# Patient Record
Sex: Male | Born: 1965
Health system: Southern US, Community
[De-identification: ages and names within clinical notes are randomized; demographics above are authoritative.]

---

## 2013-03-05 ENCOUNTER — Other Ambulatory Visit: Payer: Self-pay | Admitting: Family Medicine

## 2013-03-05 DIAGNOSIS — N6320 Unspecified lump in the left breast, unspecified quadrant: Secondary | ICD-10-CM

## 2013-03-16 ENCOUNTER — Ambulatory Visit
Admission: RE | Admit: 2013-03-16 | Discharge: 2013-03-16 | Disposition: A | Discharge status: Home / Self Care | Payer: BC Managed Care – PPO | Source: Ambulatory Visit | Attending: Family Medicine | Admitting: Family Medicine

## 2013-03-16 DIAGNOSIS — N6320 Unspecified lump in the left breast, unspecified quadrant: Secondary | ICD-10-CM

## 2014-04-29 ENCOUNTER — Other Ambulatory Visit: Payer: Self-pay | Admitting: Family Medicine

## 2014-04-29 ENCOUNTER — Ambulatory Visit
Admission: RE | Admit: 2014-04-29 | Discharge: 2014-04-29 | Disposition: A | Discharge status: Home / Self Care | Payer: BLUE CROSS/BLUE SHIELD | Source: Ambulatory Visit | Attending: Family Medicine | Admitting: Family Medicine

## 2014-04-29 DIAGNOSIS — M5412 Radiculopathy, cervical region: Secondary | ICD-10-CM

## 2014-05-10 ENCOUNTER — Other Ambulatory Visit: Payer: Self-pay | Admitting: Family Medicine

## 2014-05-10 DIAGNOSIS — M542 Cervicalgia: Secondary | ICD-10-CM

## 2014-05-13 ENCOUNTER — Ambulatory Visit
Admission: RE | Admit: 2014-05-13 | Discharge: 2014-05-13 | Disposition: A | Discharge status: Home / Self Care | Payer: BLUE CROSS/BLUE SHIELD | Source: Ambulatory Visit | Attending: Family Medicine | Admitting: Family Medicine

## 2014-05-13 DIAGNOSIS — M542 Cervicalgia: Secondary | ICD-10-CM

## 2016-03-08 IMAGING — MR MR CERVICAL SPINE W/O CM
4 of 5 series · 25 of 48 positions shown · non-contrast
Comparison: None.

CLINICAL DATA: Right arm pain and numbness beginning after lifting
weights.

EXAM:
MRI CERVICAL SPINE WITHOUT CONTRAST
TECHNIQUE: Multiplanar, multisequence MR imaging of the cervical spine was
performed. No intravenous contrast was administered.

[Series 4: T1 · sagittal · 3.3mm · 0.43mm/px · 6 of 12 slices shown]
[im 1/12]
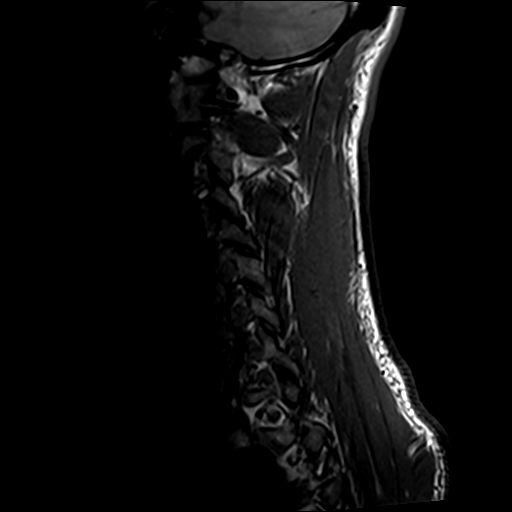
[im 3/12]
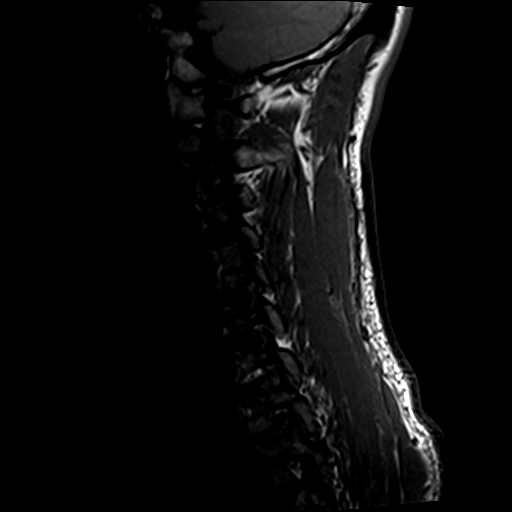
[im 5/12]
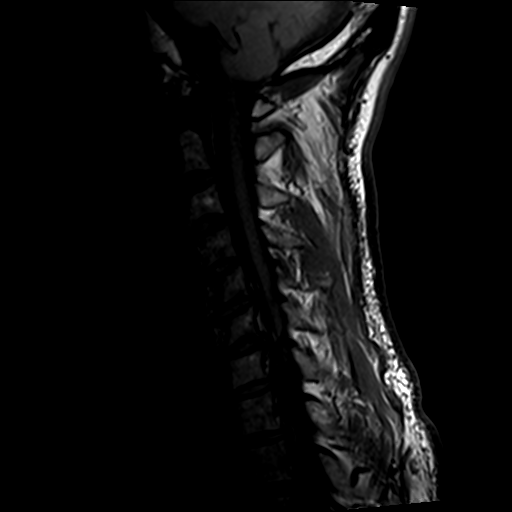
[im 7/12]
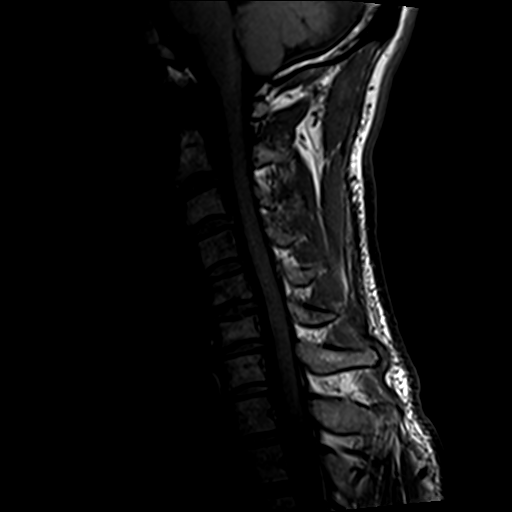
[im 9/12]
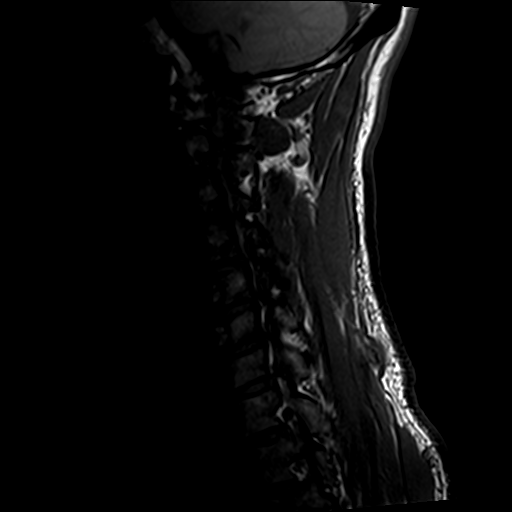
[im 12/12]
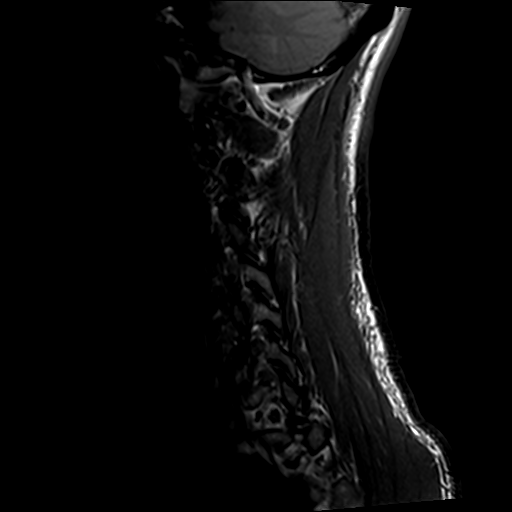

[Series 5: tir sag · sagittal · 3.3mm · 0.43mm/px · 4 of 12 slices shown]
[im 1/12]
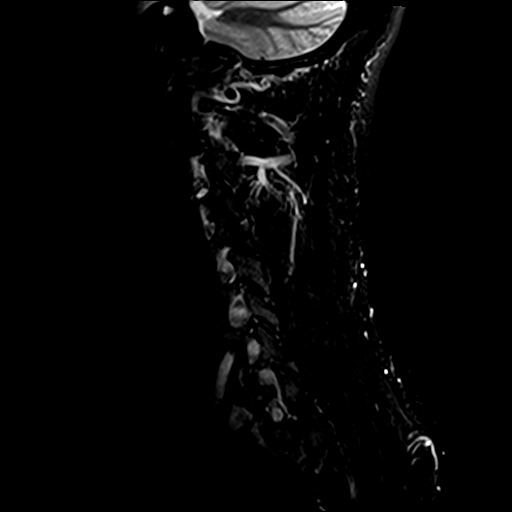
[im 2/12]
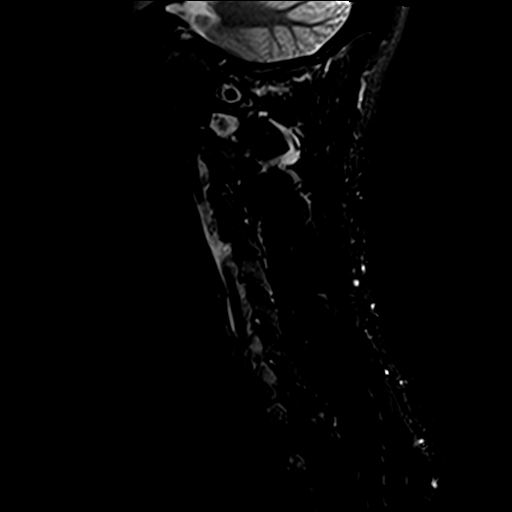
[im 6/12]
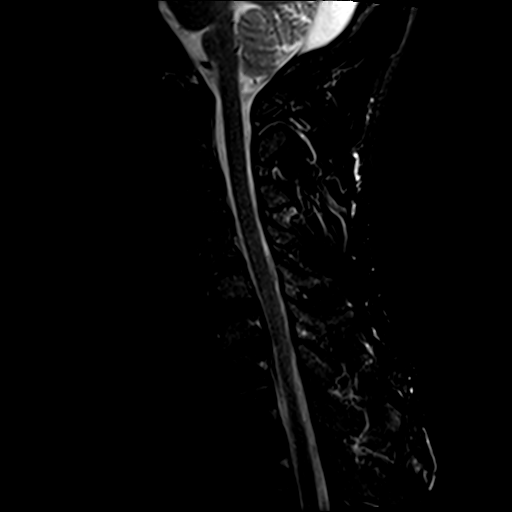
[im 10/12]
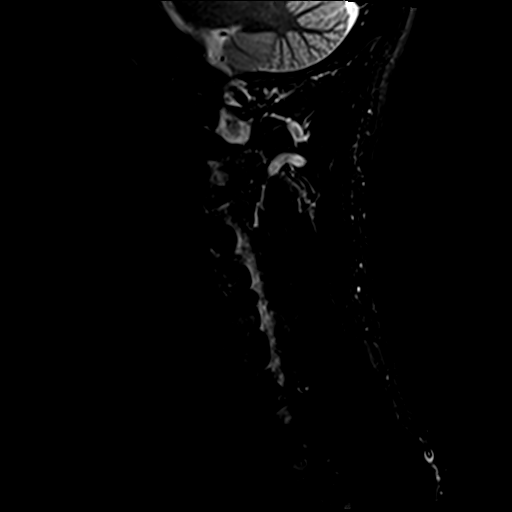

[Series 7: T2 · axial · 3.5mm · 0.70mm/px · z∈[-63,+34]mm · 8 of 24 slices shown (1 of 2)]
[im 1/24]
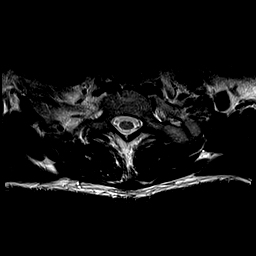
[im 4/24]
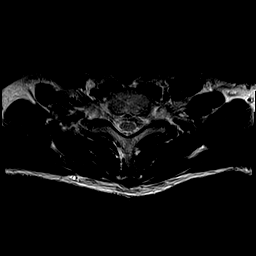
[im 8/24]
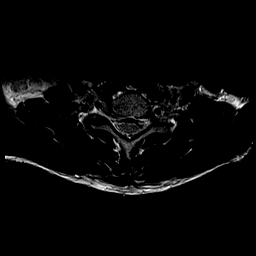
[im 11/24]
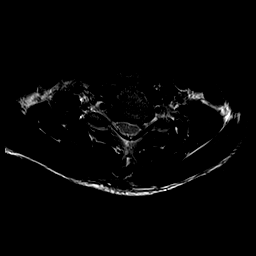
[im 13/24]
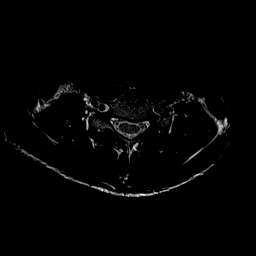
[im 16/24]
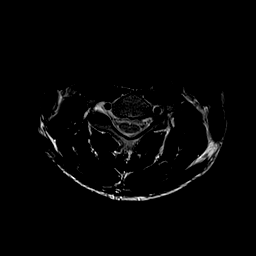
[im 20/24]
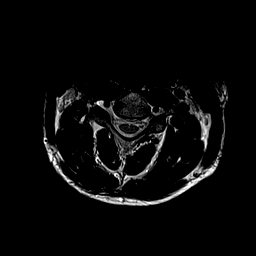
[im 24/24]
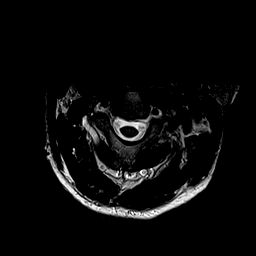

[Series 8: T2 · sagittal · 3.3mm · 0.43mm/px · 7 of 12 slices shown (2 of 2)]
[im 1/12]
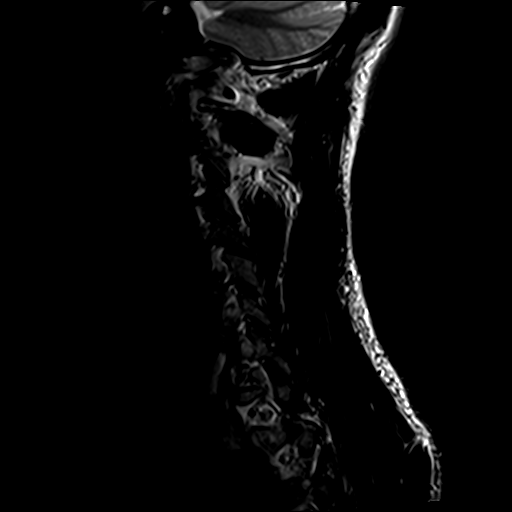
[im 2/12]
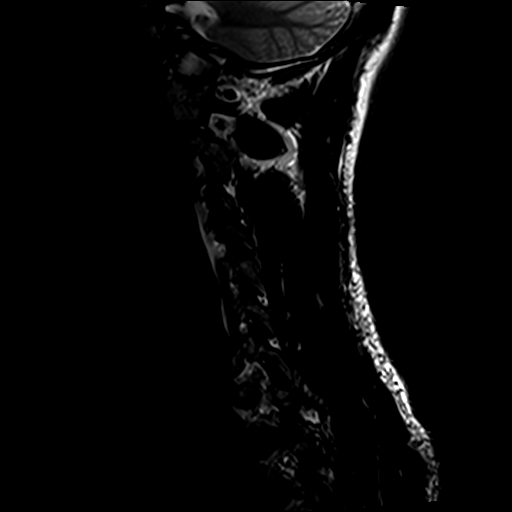
[im 4/12]
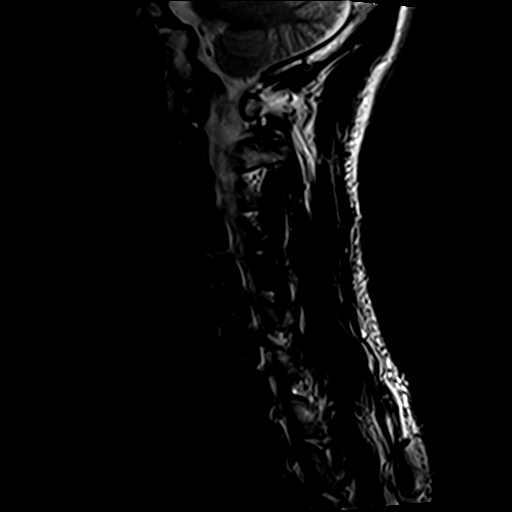
[im 6/12]
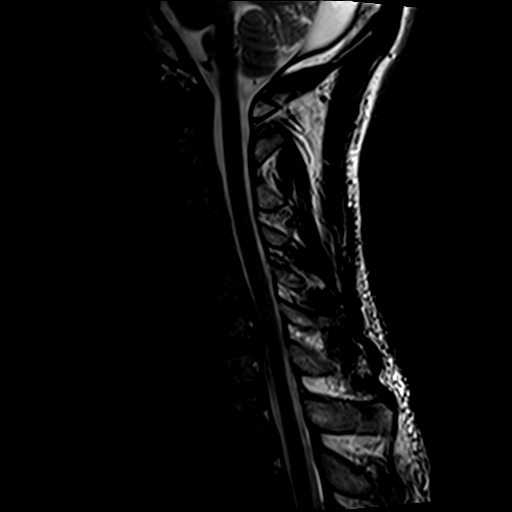
[im 8/12]
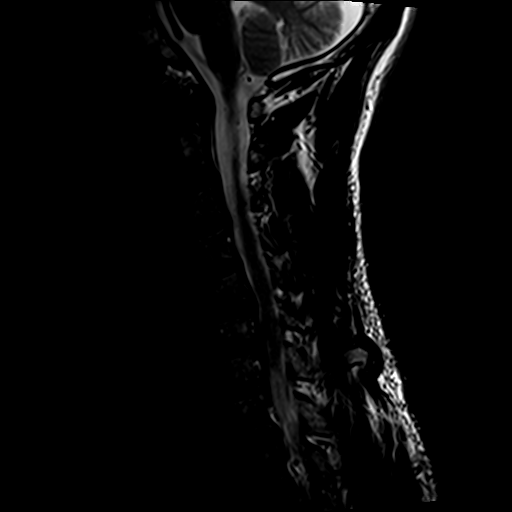
[im 10/12]
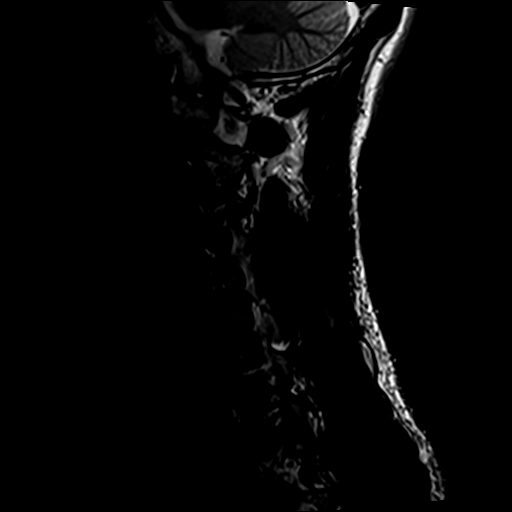
[im 12/12]
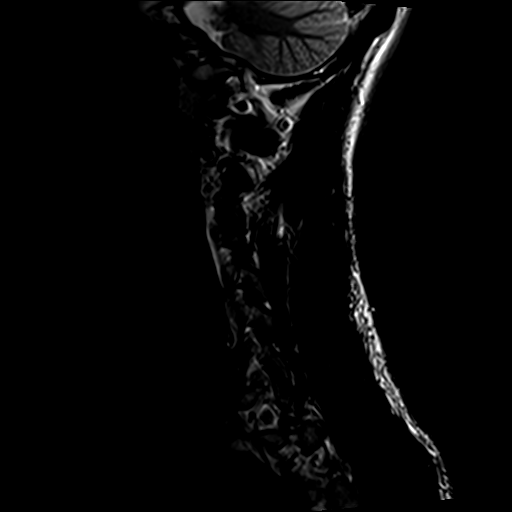

[25 of 48 positions shown; findings below may reference images not displayed]

FINDINGS: No marrow signal abnormality suggestive of fracture, infection, or
neoplasm. Normal cord signal and morphology. No extra-spinal
findings to explain pain. The vertebral arteries have normal flow
related signal loss.

Degenerative changes:

C2-3: No herniation or impingement.

C3-4: No herniation or impingement.

C4-5: No herniation or impingement.

C5-6: Mild relative disc narrowing with mild uncovertebral spurring
and foraminal disc bulge or protrusions. These changes cause
moderate bilateral foraminal stenosis. There is slight effacement of
the ventral CSF.

C6-7: Disc narrowing with small uncovertebral spurs and
right-greater-than-left foraminal disc bulge or protrusions. There
is moderate bilateral foraminal stenosis, greater on the right.

C7-T1:Unremarkable.
IMPRESSION: 1. C5-6 and C6-7 right greater than left foraminal stenosis from
uncovertebral spur and disc.
2. No other source of impingement in the cervical spine.

## 2017-09-19 DIAGNOSIS — C4492 Squamous cell carcinoma of skin, unspecified: Secondary | ICD-10-CM | POA: Diagnosis not present

## 2017-09-19 DIAGNOSIS — F331 Major depressive disorder, recurrent, moderate: Secondary | ICD-10-CM | POA: Diagnosis not present

## 2017-09-19 DIAGNOSIS — Z6823 Body mass index (BMI) 23.0-23.9, adult: Secondary | ICD-10-CM | POA: Diagnosis not present

## 2017-09-19 DIAGNOSIS — R5383 Other fatigue: Secondary | ICD-10-CM | POA: Diagnosis not present

## 2017-10-07 DIAGNOSIS — Z125 Encounter for screening for malignant neoplasm of prostate: Secondary | ICD-10-CM | POA: Diagnosis not present

## 2017-10-07 DIAGNOSIS — Z114 Encounter for screening for human immunodeficiency virus [HIV]: Secondary | ICD-10-CM | POA: Diagnosis not present

## 2017-10-07 DIAGNOSIS — Z23 Encounter for immunization: Secondary | ICD-10-CM | POA: Diagnosis not present

## 2017-10-07 DIAGNOSIS — Z6825 Body mass index (BMI) 25.0-25.9, adult: Secondary | ICD-10-CM | POA: Diagnosis not present

## 2017-10-07 DIAGNOSIS — Z1211 Encounter for screening for malignant neoplasm of colon: Secondary | ICD-10-CM | POA: Diagnosis not present

## 2017-10-07 DIAGNOSIS — Z0000 Encounter for general adult medical examination without abnormal findings: Secondary | ICD-10-CM | POA: Diagnosis not present

## 2017-10-07 DIAGNOSIS — Z13220 Encounter for screening for lipoid disorders: Secondary | ICD-10-CM | POA: Diagnosis not present

## 2017-10-31 DIAGNOSIS — C4492 Squamous cell carcinoma of skin, unspecified: Secondary | ICD-10-CM | POA: Diagnosis not present

## 2017-10-31 DIAGNOSIS — Z23 Encounter for immunization: Secondary | ICD-10-CM | POA: Diagnosis not present

## 2017-10-31 DIAGNOSIS — R5383 Other fatigue: Secondary | ICD-10-CM | POA: Diagnosis not present

## 2017-10-31 DIAGNOSIS — F331 Major depressive disorder, recurrent, moderate: Secondary | ICD-10-CM | POA: Diagnosis not present

## 2017-11-07 DIAGNOSIS — Z23 Encounter for immunization: Secondary | ICD-10-CM | POA: Diagnosis not present

## 2017-11-15 DIAGNOSIS — D229 Melanocytic nevi, unspecified: Secondary | ICD-10-CM | POA: Diagnosis not present

## 2017-11-15 DIAGNOSIS — L814 Other melanin hyperpigmentation: Secondary | ICD-10-CM | POA: Diagnosis not present

## 2017-11-15 DIAGNOSIS — D485 Neoplasm of uncertain behavior of skin: Secondary | ICD-10-CM | POA: Diagnosis not present

## 2017-11-15 DIAGNOSIS — L709 Acne, unspecified: Secondary | ICD-10-CM | POA: Diagnosis not present

## 2018-01-14 DIAGNOSIS — M19042 Primary osteoarthritis, left hand: Secondary | ICD-10-CM | POA: Diagnosis not present

## 2018-01-14 DIAGNOSIS — M779 Enthesopathy, unspecified: Secondary | ICD-10-CM | POA: Diagnosis not present

## 2018-01-14 DIAGNOSIS — H9311 Tinnitus, right ear: Secondary | ICD-10-CM | POA: Diagnosis not present

## 2018-01-14 DIAGNOSIS — J309 Allergic rhinitis, unspecified: Secondary | ICD-10-CM | POA: Diagnosis not present

## 2018-06-05 DIAGNOSIS — D229 Melanocytic nevi, unspecified: Secondary | ICD-10-CM | POA: Diagnosis not present

## 2018-06-18 DIAGNOSIS — Z23 Encounter for immunization: Secondary | ICD-10-CM | POA: Diagnosis not present

## 2018-10-08 DIAGNOSIS — G4700 Insomnia, unspecified: Secondary | ICD-10-CM | POA: Diagnosis not present

## 2018-10-08 DIAGNOSIS — M25562 Pain in left knee: Secondary | ICD-10-CM | POA: Diagnosis not present

## 2018-10-08 DIAGNOSIS — F411 Generalized anxiety disorder: Secondary | ICD-10-CM | POA: Diagnosis not present

## 2018-10-08 DIAGNOSIS — F331 Major depressive disorder, recurrent, moderate: Secondary | ICD-10-CM | POA: Diagnosis not present

## 2018-10-09 DIAGNOSIS — M25561 Pain in right knee: Secondary | ICD-10-CM | POA: Diagnosis not present

## 2018-10-09 DIAGNOSIS — M79605 Pain in left leg: Secondary | ICD-10-CM | POA: Diagnosis not present

## 2018-10-09 DIAGNOSIS — G8929 Other chronic pain: Secondary | ICD-10-CM | POA: Diagnosis not present

## 2018-10-15 DIAGNOSIS — Z23 Encounter for immunization: Secondary | ICD-10-CM | POA: Diagnosis not present

## 2018-10-22 DIAGNOSIS — M1712 Unilateral primary osteoarthritis, left knee: Secondary | ICD-10-CM | POA: Diagnosis not present

## 2018-10-22 DIAGNOSIS — M19042 Primary osteoarthritis, left hand: Secondary | ICD-10-CM | POA: Diagnosis not present

## 2018-10-22 DIAGNOSIS — F411 Generalized anxiety disorder: Secondary | ICD-10-CM | POA: Diagnosis not present

## 2018-11-13 DIAGNOSIS — D229 Melanocytic nevi, unspecified: Secondary | ICD-10-CM | POA: Diagnosis not present

## 2018-11-13 DIAGNOSIS — M170 Bilateral primary osteoarthritis of knee: Secondary | ICD-10-CM | POA: Diagnosis not present

## 2018-12-31 DIAGNOSIS — M170 Bilateral primary osteoarthritis of knee: Secondary | ICD-10-CM | POA: Diagnosis not present
